# Patient Record
Sex: Male | Born: 2000 | Race: Black or African American | Hispanic: No | Marital: Single | Smoking: Never smoker
Health system: Southern US, Community
[De-identification: ages and names within clinical notes are randomized; demographics above are authoritative.]

## PROBLEM LIST (undated history)

## (undated) DIAGNOSIS — J45909 Unspecified asthma, uncomplicated: Secondary | ICD-10-CM

---
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
## 2019-04-07 IMAGING — CR DG TOE GREAT 2+V*R*
3 series · 3 of 3 positions shown · non-contrast
Comparison: None.

CLINICAL DATA: Acute right great toe pain following soccer injury
today. Initial encounter.

EXAM:
RIGHT GREAT TOE

[toe ap]
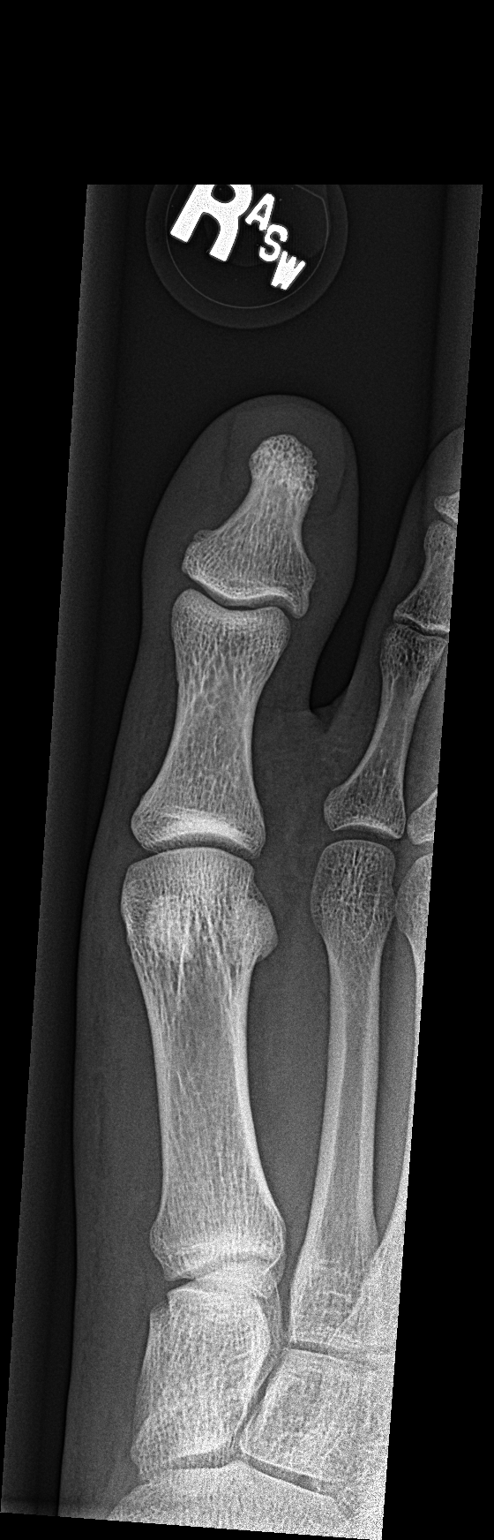

[toe obl]
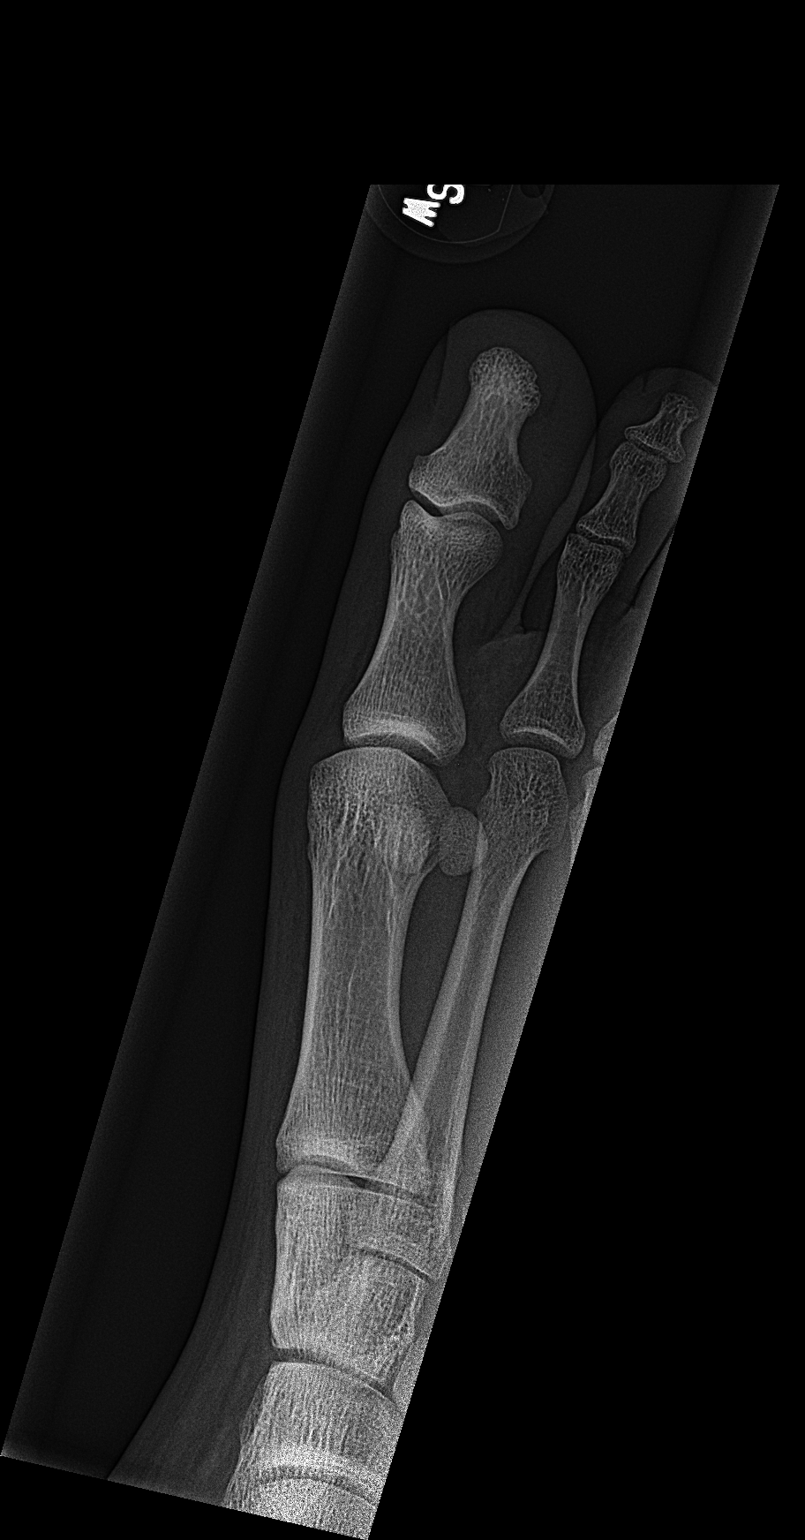

[toe lat]
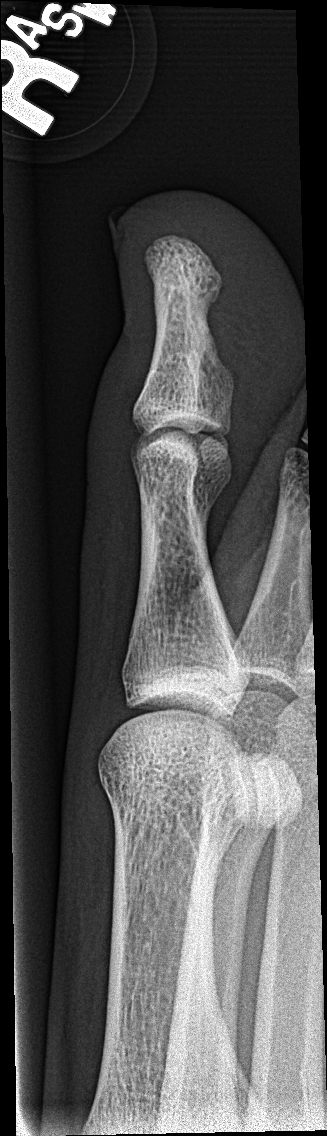

[3 of 3 positions shown; findings below may reference images not displayed]

FINDINGS: There is no evidence of fracture or dislocation. There is no
evidence of arthropathy or other focal bone abnormality. Soft
tissues are unremarkable.
IMPRESSION: Negative.

## 2021-07-10 ENCOUNTER — Ambulatory Visit
Admission: EM | Admit: 2021-07-10 | Discharge: 2021-07-10 | Disposition: A | Discharge status: Home / Self Care | Payer: BC Managed Care – PPO | Attending: Emergency Medicine | Admitting: Emergency Medicine

## 2021-07-10 ENCOUNTER — Other Ambulatory Visit: Payer: Self-pay

## 2021-07-10 DIAGNOSIS — J069 Acute upper respiratory infection, unspecified: Secondary | ICD-10-CM | POA: Diagnosis not present

## 2021-07-10 DIAGNOSIS — Z1152 Encounter for screening for COVID-19: Secondary | ICD-10-CM | POA: Insufficient documentation

## 2021-07-10 LAB — SARS CORONAVIRUS 2 (TAT 6-24 HRS): SARS Coronavirus 2: NEGATIVE

## 2021-07-10 NOTE — ED Provider Notes (Signed)
[facility]    CSN: [id number] Arrival date & time: 07/10/21  1052      History   Chief Complaint Chief Complaint  Patient presents with   Cough    HPI Peter Simpson is a 20 y.o. male.   Subjective:   Peter Simpson is a 20 y.o. male here for evaluation of a cough.  The cough is without wheezing, dyspnea or hemoptysis, productive of green/yellow sputum and is aggravated by cold air. Onset of symptoms was 3 days ago, gradually improving since that time.  Associated symptoms include runny nose and nasal congestion. Patient denies any fevers, chills, body aches, sore throat, vomiting, diarrhea, nausea, headache or dizziness. The patient reports a history of asthma as a child. Patient has not had recent travel. Patient does not have a history of smoking. Patient has a history of COVID back in June 2022 and September 2021. Patient has been vaccinated against COVID without any booster. He denies any known COVID exposure. He's been taking OTC cold/cough medicines which has been helpful with symptoms. Patient reports that his job requires him to have a negative COVID test before being able to return to work.   The following portions of the patient's history were reviewed and updated as appropriate: allergies, current medications, past family history, past medical history, past social history, past surgical history, and problem list.     Past Medical History:  Diagnosis Date   Asthma     There are no problems to display for this patient.   Past Surgical History:  Procedure Laterality Date   HERNIA REPAIR  2002   as a child       Home Medications    Prior to Admission medications   Not on File    Family History History reviewed. No pertinent family history.  Social History Social History   Tobacco Use   Smoking status: Never   Smokeless tobacco: Never  Vaping Use   Vaping Use: Never used  Substance Use Topics   Alcohol use: No   Drug use: No      Allergies   Patient has no known allergies.   Review of Systems Review of Systems  Constitutional:  Negative for fever.  HENT:  Positive for congestion and rhinorrhea. Negative for ear pain, sneezing and sore throat.   Respiratory:  Positive for cough. Negative for shortness of breath and wheezing.   Gastrointestinal:  Negative for diarrhea, nausea and vomiting.  Musculoskeletal:  Negative for myalgias.  Neurological:  Negative for dizziness and headaches.  All other systems reviewed and are negative.   Physical Exam Triage Vital Signs ED Triage Vitals  Enc Vitals Group     BP 07/10/21 1250 125/71     Pulse Rate 07/10/21 1250 89     Resp 07/10/21 1250 18     Temp 07/10/21 1250 98.8 F (37.1 C)     Temp src --      SpO2 07/10/21 1250 100 %     Weight 07/10/21 1249 190 lb (86.2 kg)     Height 07/10/21 1249 6\' 2"  (1.88 m)     Head Circumference --      Peak Flow --      Pain Score 07/10/21 1249 0     Pain Loc --      Pain Edu? --      Excl. in GC? --    No data found.  Updated Vital Signs BP 125/71 (BP Location: Right Arm)   Pulse  89   Temp 98.8 F (37.1 C)   Resp 18   Ht 6\' 2"  (1.88 m)   Wt 190 lb (86.2 kg)   SpO2 100%   BMI 24.39 kg/m   Visual Acuity Right Eye Distance:   Left Eye Distance:   Bilateral Distance:    Right Eye Near:   Left Eye Near:    Bilateral Near:     Physical Exam Constitutional:      Appearance: Normal appearance. He is not ill-appearing or toxic-appearing.  HENT:     Head: Normocephalic.     Nose: Nose normal.  Eyes:     Conjunctiva/sclera: Conjunctivae normal.  Cardiovascular:     Rate and Rhythm: Normal rate and regular rhythm.  Pulmonary:     Effort: Pulmonary effort is normal.     Breath sounds: Normal breath sounds.  Musculoskeletal:        General: Normal range of motion.     Cervical back: Normal range of motion and neck supple.  Skin:    General: Skin is warm and dry.  Neurological:     General: No focal  deficit present.     Mental Status: He is alert and oriented to person, place, and time.  Psychiatric:        Mood and Affect: Mood normal.        Behavior: Behavior normal.     UC Treatments / Results  Labs (all labs ordered are listed, but only abnormal results are displayed) Labs Reviewed  SARS CORONAVIRUS 2 (TAT 6-24 HRS)    EKG   Radiology No results found.  Procedures Procedures (including critical care time)  Medications Ordered in UC Medications - No data to display  Initial Impression / Assessment and Plan / UC Course  I have reviewed the triage vital signs and the nursing notes.  Pertinent labs & imaging results that were available during my care of the patient were reviewed by me and considered in my medical decision making (see chart for details).    20 yo male presenting with a three day history of cough, runny nose and nasal congestion. He denies any fevers, chills, body aches, sore throat, vomiting, diarrhea, nausea, headache or dizziness.  Symptoms are improving with supportive care.  Patient has a history of COVID most recently in June 2022. He is vaccinated against COVID without any boosters.  Denies any known COVID exposure.  Patient needs to have a negative COVID test in order to return to work.  Advised to continue supportive care for symptom management.  Isolation until COVID test has returned.  May go to work if COVID test is negative.  Today's evaluation has revealed no signs of a dangerous process. Discussed diagnosis with patient and/or guardian. Patient and/or guardian aware of their diagnosis, possible red flag symptoms to watch out for and need for close follow up. Patient and/or guardian understands verbal and written discharge instructions. Patient and/or guardian comfortable with plan and disposition.  Patient and/or guardian has a clear mental status at this time, good insight into illness (after discussion and teaching) and has clear judgment to  make decisions regarding their care  This care was provided during an unprecedented National Emergency due to the Novel Coronavirus (COVID-19) pandemic. COVID-19 infections and transmission risks place heavy strains on healthcare resources.  As this pandemic evolves, our facility, providers, and staff strive to respond fluidly, to remain operational, and to provide care relative to available resources and information. Outcomes are unpredictable and treatments  are without well-defined guidelines. Further, the impact of COVID-19 on all aspects of urgent care, including the impact to patients seeking care for reasons other than COVID-19, is unavoidable during this national emergency. At this time of the global pandemic, management of patients has significantly changed, even for non-COVID positive patients given high local and regional COVID volumes at this time requiring high healthcare system and resource utilization. The standard of care for management of both COVID suspected and non-COVID suspected patients continues to change rapidly at the local, regional, national, and global levels. This patient was worked up and treated to the best available but ever changing evidence and resources available at this current time.   Documentation was completed with the aid of voice recognition software. Transcription may contain typographical errors.   Final Clinical Impressions(s) / UC Diagnoses   Final diagnoses:  Viral URI with cough  Encounter for screening for COVID-19     Discharge Instructions      Continue the over-the-counter medications for your symptoms. You may take tylenol or ibuprofen as needed for fevers/headache/body aches. Drink plenty of fluids. Stay in home isolation until you receive results of your COVID test. It should be back between 6-24 hours. You will only be notified for positive results. You may go online to MyChart and review your results.      ED Prescriptions   None     PDMP not reviewed this encounter.   Lurline Idol, FNP 07/10/21 1340

## 2021-07-10 NOTE — ED Triage Notes (Signed)
Pt here with C/O cough, nasal congestion for 3 days. Needs Covid testing for work

## 2021-07-10 NOTE — Discharge Instructions (Signed)
Continue the over-the-counter medications for your symptoms. You may take tylenol or ibuprofen as needed for fevers/headache/body aches. Drink plenty of fluids. Stay in home isolation until you receive results of your COVID test. It should be back between 6-24 hours. You will only be notified for positive results. You may go online to MyChart and review your results.

## 2023-06-30 ENCOUNTER — Ambulatory Visit (INDEPENDENT_AMBULATORY_CARE_PROVIDER_SITE_OTHER): Payer: BC Managed Care – PPO

## 2023-06-30 ENCOUNTER — Encounter: Payer: Self-pay | Admitting: Emergency Medicine

## 2023-06-30 ENCOUNTER — Ambulatory Visit
Admission: EM | Admit: 2023-06-30 | Discharge: 2023-06-30 | Disposition: A | Discharge status: Home / Self Care | Payer: BC Managed Care – PPO | Attending: Emergency Medicine | Admitting: Emergency Medicine

## 2023-06-30 DIAGNOSIS — S93601A Unspecified sprain of right foot, initial encounter: Secondary | ICD-10-CM | POA: Diagnosis not present

## 2023-06-30 DIAGNOSIS — S93401A Sprain of unspecified ligament of right ankle, initial encounter: Secondary | ICD-10-CM | POA: Diagnosis not present

## 2023-06-30 NOTE — ED Provider Notes (Signed)
[facility]    CSN: [id number] Arrival date & time: 06/30/23  1358      History   Chief Complaint Chief Complaint  Patient presents with   Fall   Ankle Pain    right    HPI Peter Simpson is a 22 y.o. male.   HPI  22 year old male with past medical history significant for asthma presents for evaluation of pain and swelling to his right foot and ankle.  He reports that he was walking last night, stepped in a pothole, and rolled his right ankle.  He is able to ambulate and bear weight though with pain.  He denies any numbness or tingling in his foot or toes.  Past Medical History:  Diagnosis Date   Asthma     There are no problems to display for this patient.   Past Surgical History:  Procedure Laterality Date   HERNIA REPAIR  2002   as a child       Home Medications    Prior to Admission medications   Not on File    Family History History reviewed. No pertinent family history.  Social History Social History   Tobacco Use   Smoking status: Never   Smokeless tobacco: Never  Vaping Use   Vaping status: Never Used  Substance Use Topics   Alcohol use: No   Drug use: No     Allergies   Patient has no known allergies.   Review of Systems Review of Systems  Musculoskeletal:  Positive for arthralgias and joint swelling.  Skin:  Negative for color change.  Neurological:  Negative for weakness and numbness.     Physical Exam Triage Vital Signs ED Triage Vitals  Encounter Vitals Group     BP      Systolic BP Percentile      Diastolic BP Percentile      Pulse      Resp      Temp      Temp src      SpO2      Weight      Height      Head Circumference      Peak Flow      Pain Score      Pain Loc      Pain Education      Exclude from Growth Chart    No data found.  Updated Vital Signs BP 133/86 (BP Location: Left Arm)   Pulse 73   Temp 98.1 F (36.7 C) (Oral)   Resp 15   Ht 6\' 2"  (1.88 m)   Wt 190 lb 0.6 oz (86.2 kg)    SpO2 99%   BMI 24.40 kg/m   Visual Acuity Right Eye Distance:   Left Eye Distance:   Bilateral Distance:    Right Eye Near:   Left Eye Near:    Bilateral Near:     Physical Exam Vitals and nursing note reviewed.  Constitutional:      Appearance: Normal appearance. He is not ill-appearing.  Musculoskeletal:        General: Swelling, tenderness and signs of injury present. No deformity. Normal range of motion.  Skin:    General: Skin is warm and dry.     Capillary Refill: Capillary refill takes less than 2 seconds.     Findings: No bruising or erythema.  Neurological:     General: No focal deficit present.     Mental Status: He is alert and oriented to person,  place, and time.      UC Treatments / Results  Labs (all labs ordered are listed, but only abnormal results are displayed) Labs Reviewed - No data to display  EKG   Radiology No results found.  Procedures Procedures (including critical care time)  Medications Ordered in UC Medications - No data to display  Initial Impression / Assessment and Plan / UC Course  I have reviewed the triage vital signs and the nursing notes.  Pertinent labs & imaging results that were available during my care of the patient were reviewed by me and considered in my medical decision making (see chart for details).   Patient is a pleasant, nontoxic appearing 22 year old male presenting for evaluation of pain in his right foot and underneath his right ankle that began after he rolled his ankle by stepping in a hole last night.  He has full range of motion of his foot and ankle as well as full sensation in his toes.  DP and PT pulses are 2+.  There is mild tenderness with palpation of the lateral malleolus as well as in the midshaft of the fifth metatarsal.  Remainder of the foot and ankle are benign.  I will obtain radiograph of right foot and ankle to rule out any bony abnormality.  Right ankle films independently reviewed and  evaluated by me.  Impression: The ankle mortise joint is well-maintained.  No evidence of fracture or dislocation.  Radiology overread is pending.  Right foot films independently reviewed and evaluated by me.  Impression: No evidence of fracture or dislocation.  Mild soft tissue swelling is present.  Patient has pes planus.  Radiology overread is pending.  I will discharge patient home with a diagnosis of right foot and ankle sprain and outfit him with an Aircast for stability.  Home physical therapy exercises reviewed with the patient.  He can use over-the-counter Tylenol and or ibuprofen according to package instructions as needed for pain along with elevation and ice application.  Return precautions reviewed.  Work note provided.   Final Clinical Impressions(s) / UC Diagnoses   Final diagnoses:  Right foot sprain, initial encounter  Sprain of right ankle, unspecified ligament, initial encounter     Discharge Instructions      Keep your foot and ankle elevated is much as possible to help decrease swelling and aid in healing.  Apply moist heat to your foot and ankle for 20 minutes at a time to help improve blood flow which will bring fresh oxygen and nutrients to the ligaments and help facilitate the removal of metabolic byproducts from inflammation.  Take over-the-counter ibuprofen, 600 mg (3 tablets) every 6 hours with food to help with inflammation and pain.  Wear the air cast ankle brace when up and moving.  You may take it off at nighttime, when bathing, and when not walking on her ankle.  Follow the rehabilitation exercises given your discharge instructions.  Wait to start the phase 1 exercises until 48 hours after your injury to give time for the inflammation to go down.  Progress to phase 2 after you can complete phase 1 with out any significant pain.      ED Prescriptions   None    PDMP not reviewed this encounter.   Becky Augusta, NP 06/30/23 1520

## 2023-06-30 NOTE — ED Triage Notes (Signed)
Patient states that he stepped in a pot hole last night and rolled his right ankle.  Patient c/o pain in his right ankle and foot.

## 2023-06-30 NOTE — Discharge Instructions (Addendum)
Keep your foot and ankle elevated is much as possible to help decrease swelling and aid in healing.  Apply moist heat to your foot and ankle for 20 minutes at a time to help improve blood flow which will bring fresh oxygen and nutrients to the ligaments and help facilitate the removal of metabolic byproducts from inflammation.  Take over-the-counter ibuprofen, 600 mg (3 tablets) every 6 hours with food to help with inflammation and pain.  Wear the air cast ankle brace when up and moving.  You may take it off at nighttime, when bathing, and when not walking on her ankle.  Follow the rehabilitation exercises given your discharge instructions.  Wait to start the phase 1 exercises until 48 hours after your injury to give time for the inflammation to go down.  Progress to phase 2 after you can complete phase 1 with out any significant pain.

## 2023-07-05 ENCOUNTER — Ambulatory Visit
Admission: EM | Admit: 2023-07-05 | Discharge: 2023-07-05 | Disposition: A | Discharge status: Home / Self Care | Payer: BC Managed Care – PPO

## 2023-07-05 NOTE — ED Triage Notes (Signed)
Pt is here to check on his right ankle pain.  Pt states that he was seen last Sunday for an ankle sprain and is wearing a brace.  Pt states that he was allowed to go back to work without restrictions.
